# Patient Record
Sex: Female | Born: 1980 | Race: Black or African American | Hispanic: No | Marital: Single | Smoking: Current every day smoker
Health system: Southern US, Community
[De-identification: ages and names within clinical notes are randomized; demographics above are authoritative.]

## PROBLEM LIST (undated history)

## (undated) HISTORY — PX: HAND SURGERY: SHX662

## (undated) HISTORY — PX: TUBAL LIGATION: SHX77

---

## 2014-08-19 ENCOUNTER — Emergency Department (HOSPITAL_BASED_OUTPATIENT_CLINIC_OR_DEPARTMENT_OTHER)
Admission: EM | Admit: 2014-08-19 | Discharge: 2014-08-19 | Disposition: A | Discharge status: Home / Self Care | Payer: Self-pay | Attending: Emergency Medicine | Admitting: Emergency Medicine

## 2014-08-19 ENCOUNTER — Emergency Department (HOSPITAL_BASED_OUTPATIENT_CLINIC_OR_DEPARTMENT_OTHER): Payer: Self-pay

## 2014-08-19 ENCOUNTER — Encounter (HOSPITAL_BASED_OUTPATIENT_CLINIC_OR_DEPARTMENT_OTHER): Payer: Self-pay | Admitting: Emergency Medicine

## 2014-08-19 DIAGNOSIS — Y929 Unspecified place or not applicable: Secondary | ICD-10-CM | POA: Insufficient documentation

## 2014-08-19 DIAGNOSIS — W2209XA Striking against other stationary object, initial encounter: Secondary | ICD-10-CM | POA: Insufficient documentation

## 2014-08-19 DIAGNOSIS — Y939 Activity, unspecified: Secondary | ICD-10-CM | POA: Insufficient documentation

## 2014-08-19 DIAGNOSIS — S6980XA Other specified injuries of unspecified wrist, hand and finger(s), initial encounter: Secondary | ICD-10-CM | POA: Insufficient documentation

## 2014-08-19 DIAGNOSIS — S6992XA Unspecified injury of left wrist, hand and finger(s), initial encounter: Secondary | ICD-10-CM

## 2014-08-19 DIAGNOSIS — S6990XA Unspecified injury of unspecified wrist, hand and finger(s), initial encounter: Secondary | ICD-10-CM | POA: Insufficient documentation

## 2014-08-19 DIAGNOSIS — F17200 Nicotine dependence, unspecified, uncomplicated: Secondary | ICD-10-CM | POA: Insufficient documentation

## 2014-08-19 MED ORDER — NAPROXEN 500 MG PO TABS: 500.0000 mg | ORAL_TABLET | Freq: Two times a day (BID) | ORAL | Status: DC

## 2014-08-19 NOTE — ED Provider Notes (Signed)
CSN: [id number]     Arrival date & time 08/19/14  1617 History   First MD Initiated Contact with Patient 08/19/14 1622     Chief Complaint  Patient presents with  . Hand Pain     (Consider location/radiation/quality/duration/timing/severity/associated sxs/prior Treatment) Patient is a 33 y.o. female presenting with hand pain. The history is provided by the patient.  Hand Pain Pertinent negatives include no chest pain, no abdominal pain, no headaches and no shortness of breath.   patient with injury to the left palm when he hit the wall and then her thumb back that was 3 days ago. Patient with persistent swelling and pain and bruising. No other injuries. No previous injury to the thumb. Patient is right-hand dominant. Pain is currently 6/10. Worse with movement. Sharp in nature.  History reviewed. No pertinent past medical history. History reviewed. No pertinent past surgical history. No family history on file. History  Substance Use Topics  . Smoking status: Current Every Day Smoker  . Smokeless tobacco: Not on file  . Alcohol Use: Yes   OB History   Grav Para Term Preterm Abortions TAB SAB Ect Mult Living                 Review of Systems  Constitutional: Negative for fever.  HENT: Negative for congestion.   Eyes: Negative for redness.  Respiratory: Negative for shortness of breath.   Cardiovascular: Negative for chest pain.  Gastrointestinal: Negative for nausea and abdominal pain.  Musculoskeletal: Negative for neck pain.  Skin: Negative for rash.  Neurological: Negative for headaches.  Hematological: Does not bruise/bleed easily.  Psychiatric/Behavioral: Negative for confusion.      Allergies  Review of patient's allergies indicates no known allergies.  Home Medications   Prior to Admission medications   Medication Sig Start Date End Date Taking? Authorizing Provider  naproxen (NAPROSYN) 500 MG tablet Take 1 tablet (500 mg total) by mouth 2 (two) times daily.  08/19/14   Vanetta Mulders, MD   BP 101/73  Pulse 70  Temp(Src) 98.2 F (36.8 C)  Resp 16  Ht  (1.702 m)  Wt 130 lb (58.968 kg)  BMI 20.36 kg/m2  SpO2 100%  LMP 08/18/2014 Physical Exam  Nursing note and vitals reviewed. Constitutional: She is oriented to person, place, and time. She appears well-developed and well-nourished. No distress.  HENT:  Head: Normocephalic and atraumatic.  Mouth/Throat: Oropharynx is clear and moist.  Eyes: Conjunctivae and EOM are normal. Pupils are equal, round, and reactive to light.  Neck: Normal range of motion.  Cardiovascular: Normal rate and regular rhythm.   No murmur heard. Pulmonary/Chest: Effort normal and breath sounds normal. No respiratory distress.  Abdominal: Soft. Bowel sounds are normal. There is no tenderness.  Musculoskeletal: She exhibits tenderness.  Normal except for left palm with marked bruising of the thenar eminence. Also lots of bruising at the proximal joint. Good cap refill sensation intact. Decreased range of motion due to pain but very suspicious for a gamekeepers type injury.  Neurological: She is alert and oriented to person, place, and time. No cranial nerve deficit. She exhibits normal muscle tone. Coordination normal.  Skin: Skin is warm.    ED Course  Procedures (including critical care time) Labs Review Labs Reviewed - No data to display  Imaging Review Dg Finger Thumb Left  08/19/2014   CLINICAL DATA:  Blow to the left thumb, pain.  EXAM: LEFT THUMB 2+V  COMPARISON:  None.  FINDINGS: Imaged bones, joints  and soft tissues appear normal.  IMPRESSION: Negative exam.   Electronically Signed   By: Drusilla Kanner M.D.   On: 08/19/2014 16:46     EKG Interpretation None      MDM   Final diagnoses:  Thumb injury, left, initial encounter    Patient with injury to the left thumb area suspicious for a gamekeepers of ligament injury. Patient referred to hand surgery treated with a Velcro splint and  Naprosyn. Patient given a work note for no use of the left hand for 14 days.    Vanetta Mulders, MD 08/19/14 [phone]

## 2014-08-19 NOTE — ED Notes (Signed)
Reports hitting left thumb on wall Saturday. C/o pain, swelling and bruising

## 2014-08-19 NOTE — Discharge Instructions (Signed)
Keep splint in place. Take Naprosyn as directed. Followup with hand surgery. Injuries concerning for a gamekeepers injury. Work note provided.

## 2014-08-19 NOTE — ED Notes (Signed)
Patient transported to and from xray dept. 

## 2014-12-28 ENCOUNTER — Encounter (HOSPITAL_BASED_OUTPATIENT_CLINIC_OR_DEPARTMENT_OTHER): Payer: Self-pay

## 2014-12-28 ENCOUNTER — Emergency Department (HOSPITAL_BASED_OUTPATIENT_CLINIC_OR_DEPARTMENT_OTHER)
Admission: EM | Admit: 2014-12-28 | Discharge: 2014-12-28 | Disposition: A | Discharge status: Home / Self Care | Payer: Self-pay | Attending: Emergency Medicine | Admitting: Emergency Medicine

## 2014-12-28 DIAGNOSIS — Z791 Long term (current) use of non-steroidal anti-inflammatories (NSAID): Secondary | ICD-10-CM | POA: Insufficient documentation

## 2014-12-28 DIAGNOSIS — H00016 Hordeolum externum left eye, unspecified eyelid: Secondary | ICD-10-CM

## 2014-12-28 DIAGNOSIS — H00014 Hordeolum externum left upper eyelid: Secondary | ICD-10-CM | POA: Insufficient documentation

## 2014-12-28 DIAGNOSIS — Z720 Tobacco use: Secondary | ICD-10-CM | POA: Insufficient documentation

## 2014-12-28 NOTE — ED Provider Notes (Signed)
CSN: [id number]     Arrival date & time 12/28/14  1113 History   First MD Initiated Contact with Patient 12/28/14 1123     Chief Complaint  Patient presents with  . Eye Problem     (Consider location/radiation/quality/duration/timing/severity/associated sxs/prior Treatment) HPI Comments: Patient presents today with erythema and swelling of the left upper eyelid that has been present since yesterday morning.  She denies any injury or trauma to the eye.  She denies pain, but does report that her eye feels itchy.  She denies any vision changes, eye drainage, fever, or chills.  She does not wear contact lenses.  No treatment prior to arrival.  The history is provided by the patient.    History reviewed. No pertinent past medical history. Past Surgical History  Procedure Laterality Date  . Cesarean section     No family history on file. History  Substance Use Topics  . Smoking status: Light Tobacco Smoker  . Smokeless tobacco: Not on file  . Alcohol Use: Yes     Comment: occ   OB History    No data available     Review of Systems  All other systems reviewed and are negative.     Allergies  Review of patient's allergies indicates no known allergies.  Home Medications   Prior to Admission medications   Medication Sig Start Date End Date Taking? Authorizing Provider  naproxen (NAPROSYN) 500 MG tablet Take 1 tablet (500 mg total) by mouth 2 (two) times daily. 08/19/14   Vanetta Mulders, MD   BP 111/73 mmHg  Pulse 70  Temp(Src) 98.1 F (36.7 C) (Oral)  Resp 18  SpO2 100% Physical Exam  Constitutional: She appears well-developed and well-nourished.  HENT:  Head: Normocephalic and atraumatic.  Eyes: EOM are normal. Pupils are equal, round, and reactive to light. Lids are everted and swept, no foreign bodies found. Left eye exhibits hordeolum. Left eye exhibits no discharge and no exudate. No foreign body present in the left eye. Left conjunctiva is not injected. Left  conjunctiva has no hemorrhage.   Mild erythema and swelling of the left upper eyelid. No pain with EOM.  Visual Acuity  Right Eye Distance: 20/20 Left Eye Distance: 20/20 Bilateral Distance: 20/20       Neck: Normal range of motion. Neck supple.  Cardiovascular: Normal rate, regular rhythm and normal heart sounds.   Pulmonary/Chest: Effort normal and breath sounds normal.  Neurological: She is alert.  Skin: Skin is warm and dry.  Psychiatric: She has a normal mood and affect.    ED Course  Procedures (including critical care time) Labs Review Labs Reviewed - No data to display  Imaging Review No results found.   EKG Interpretation None      MDM   Final diagnoses:  Sty, left   Patient presents with a sty of her left upper eyelid.  No evidence of Conjunctivitis.  Normal visual acuity.  No foreign body.  Normal EOM without pain.  Feel that the patient is stable for discharge.  Return precautions given.     Santiago Glad, PA-C 12/29/14 1259  Glynn Octave, MD 12/29/14 [phone]

## 2014-12-28 NOTE — ED Notes (Signed)
Pt reports since yesterday with redness and swelling to L eyelid.  Pruritic.

## 2014-12-28 NOTE — Discharge Instructions (Signed)

## 2015-08-20 ENCOUNTER — Emergency Department (HOSPITAL_BASED_OUTPATIENT_CLINIC_OR_DEPARTMENT_OTHER): Payer: Worker's Compensation

## 2015-08-20 ENCOUNTER — Encounter (HOSPITAL_BASED_OUTPATIENT_CLINIC_OR_DEPARTMENT_OTHER): Payer: Self-pay | Admitting: Emergency Medicine

## 2015-08-20 ENCOUNTER — Emergency Department (HOSPITAL_BASED_OUTPATIENT_CLINIC_OR_DEPARTMENT_OTHER)
Admission: EM | Admit: 2015-08-20 | Discharge: 2015-08-20 | Disposition: A | Discharge status: Home / Self Care | Payer: Worker's Compensation | Attending: Emergency Medicine | Admitting: Emergency Medicine

## 2015-08-20 DIAGNOSIS — Y9389 Activity, other specified: Secondary | ICD-10-CM | POA: Diagnosis not present

## 2015-08-20 DIAGNOSIS — S61219A Laceration without foreign body of unspecified finger without damage to nail, initial encounter: Secondary | ICD-10-CM

## 2015-08-20 DIAGNOSIS — S61212A Laceration without foreign body of right middle finger without damage to nail, initial encounter: Secondary | ICD-10-CM | POA: Insufficient documentation

## 2015-08-20 DIAGNOSIS — Z720 Tobacco use: Secondary | ICD-10-CM | POA: Insufficient documentation

## 2015-08-20 DIAGNOSIS — S61214A Laceration without foreign body of right ring finger without damage to nail, initial encounter: Secondary | ICD-10-CM | POA: Diagnosis not present

## 2015-08-20 DIAGNOSIS — Y990 Civilian activity done for income or pay: Secondary | ICD-10-CM | POA: Diagnosis not present

## 2015-08-20 DIAGNOSIS — W290XXA Contact with powered kitchen appliance, initial encounter: Secondary | ICD-10-CM | POA: Insufficient documentation

## 2015-08-20 DIAGNOSIS — Y9289 Other specified places as the place of occurrence of the external cause: Secondary | ICD-10-CM | POA: Insufficient documentation

## 2015-08-20 DIAGNOSIS — R52 Pain, unspecified: Secondary | ICD-10-CM

## 2015-08-20 MED ORDER — OXYCODONE-ACETAMINOPHEN 5-325 MG PO TABS
1.0000 | ORAL_TABLET | Freq: Once | ORAL | Status: AC
  Administered 2015-08-20: 1 via ORAL
  Filled 2015-08-20: qty 1

## 2015-08-20 MED ORDER — BUPIVACAINE HCL 0.5 % IJ SOLN
20.0000 mL | Freq: Once | INTRAMUSCULAR | Status: AC
  Administered 2015-08-20: 1 mL
  Filled 2015-08-20: qty 1

## 2015-08-20 NOTE — ED Notes (Signed)
UDS obtained by Nando, EMT.

## 2015-08-20 NOTE — ED Notes (Signed)
Injury to rt long and ring fingers.  Injury ar work fingers cut on a bread slicer

## 2015-08-20 NOTE — ED Provider Notes (Signed)
CSN: [id number]     Arrival date & time 08/20/15  7829 History   First MD Initiated Contact with Patient 08/20/15 [phone]     Chief Complaint  Patient presents with  . Extremity Laceration      HPI  Patient since evaluation of right hand lacerations to her right middle, and ring finger. She works at a [employer]. She was cutting some bread on a electrical spitting bread slicer. Some red became caught sheet and could remove it. She Palms Side of Her Right Hand against That. Lacerated His 2 Fingers. She States They Seem to Move Normally. Normal Sensation. States "They Bled A Lot".  History reviewed. No pertinent past medical history. Past Surgical History  Procedure Laterality Date  . Cesarean section     No family history on file. Social History  Substance Use Topics  . Smoking status: Light Tobacco Smoker  . Smokeless tobacco: None  . Alcohol Use: Yes     Comment: occ   OB History    No data available     Review of Systems  Skin: Positive for wound.      Allergies  Review of patient's allergies indicates no known allergies.  Home Medications   Prior to Admission medications   Not on File   BP 104/67 mmHg  Pulse 79  Temp(Src) 98.3 F (36.8 C) (Oral)  Resp 18  Ht  (1.702 m)  Wt 130 lb (58.968 kg)  BMI 20.36 kg/m2  SpO2 100% Physical Exam  Constitutional: She is oriented to person, place, and time. She appears well-developed and well-nourished. No distress.  HENT:  Head: Normocephalic.  Eyes: Conjunctivae are normal. Pupils are equal, round, and reactive to light. No scleral icterus.  Neck: Normal range of motion. Neck supple. No thyromegaly present.  Cardiovascular: Normal rate and regular rhythm.  Exam reveals no gallop and no friction rub.   No murmur heard. Pulmonary/Chest: Effort normal and breath sounds normal. No respiratory distress. She has no wheezes. She has no rales.  Abdominal: Soft. Bowel sounds are normal. She exhibits no distension. There  is no tenderness. There is no rebound.  Musculoskeletal: Normal range of motion.       Hands: Neurological: She is alert and oriented to person, place, and time.  Skin: Skin is warm and dry. No rash noted.  Psychiatric: She has a normal mood and affect. Her behavior is normal.    ED Course  Procedures (including critical care time) Labs Review Labs Reviewed - No data to display  Imaging Review Dg Hand Complete Right  08/20/2015   CLINICAL DATA:  Trauma to the third and fourth digits with a bread slicer, initial encounter  EXAM: RIGHT HAND - COMPLETE 3+ VIEW  COMPARISON:  None.  FINDINGS: Soft tissue injury is noted in the distal aspect of the third and fourth digits. No underlying bony abnormality is seen.  IMPRESSION: Soft tissue injury in the third and fourth digits without underlying bony abnormality.   Electronically Signed   By: Alcide Clever M.D.   On: 08/20/2015 10:39   I have personally reviewed and evaluated these images and lab results as part of my medical decision-making.   EKG Interpretation None      MDM   Final diagnoses:  Pain  Finger laceration, initial encounter    LACERATION REPAIR Performed by: Claudean Kinds Authorized by: Claudean Kinds Consent: Verbal consent obtained. Risks and benefits: risks, benefits and alternatives were discussed Consent given by: patient  Patient identity confirmed: provided demographic data Prepped and Draped in normal sterile fashion Wound explored  Laceration Location: RMF, RRF  Laceration Length:  Total 4 cmcm  No Foreign Bodies seen or palpated  Anesthesia: local infiltration  Local anesthetic: lidocaine 1% s epinephrine  Anesthetic total: 6 ml, digital blocks x 2  Irrigation method: syringe Amount of cleaning: standard  Skin closure: 4-0 Nylon  Number of sutures: 12  Technique: simple  Patient tolerance: Patient tolerated the procedure well with no immediate complications.     Rolland Porter,  MD 08/20/15 1416

## 2015-08-20 NOTE — Discharge Instructions (Signed)
°  Suture removal in 10 days. Recheck with your work's occupational health clinic for removal.  Laceration Care, Adult A laceration is a cut that goes through all layers of the skin. The cut goes into the tissue beneath the skin. HOME CARE For stitches (sutures) or staples:  Keep the cut clean and dry.  If you have a bandage (dressing), change it at least once a day. Change the bandage if it gets wet or dirty, or as told by your doctor.  Wash the cut with soap and water 2 times a day. Rinse the cut with water. Pat it dry with a clean towel.  Put a thin layer of medicated cream on the cut as told by your doctor.  You may shower after the first 24 hours. Do not soak the cut in water until the stitches are removed.  Only take medicines as told by your doctor.  Have your stitches or staples removed as told by your doctor. For skin adhesive strips:  Keep the cut clean and dry.  Do not get the strips wet. You may take a bath, but be careful to keep the cut dry.  If the cut gets wet, pat it dry with a clean towel.  The strips will fall off on their own. Do not remove the strips that are still stuck to the cut. For wound glue:  You may shower or take baths. Do not soak or scrub the cut. Do not swim. Avoid heavy sweating until the glue falls off on its own. After a shower or bath, pat the cut dry with a clean towel.  Do not put medicine on your cut until the glue falls off.  If you have a bandage, do not put tape over the glue.  Avoid lots of sunlight or tanning lamps until the glue falls off. Put sunscreen on the cut for the first year to reduce your scar.  The glue will fall off on its own. Do not pick at the glue. You may need a tetanus shot if:  You cannot remember when you had your last tetanus shot.  You have never had a tetanus shot. If you need a tetanus shot and you choose not to have one, you may get tetanus. Sickness from tetanus can be serious. GET HELP RIGHT AWAY IF:    Your pain does not get better with medicine.  Your arm, hand, leg, or foot loses feeling (numbness) or changes color.  Your cut is bleeding.  Your joint feels weak, or you cannot use your joint.  You have painful lumps on your body.  Your cut is red, puffy (swollen), or painful.  You have a red line on the skin near the cut.  You have yellowish-white fluid (pus) coming from the cut.  You have a fever.  You have a bad smell coming from the cut or bandage.  Your cut breaks open before or after stitches are removed.  You notice something coming out of the cut, such as wood or glass.  You cannot move a finger or toe. MAKE SURE YOU:   Understand these instructions.  Will watch your condition.  Will get help right away if you are not doing well or get worse. Document Released: 05/29/2008 Document Revised: 03/04/2012 Document Reviewed: 06/06/2011 [facility] Patient Information 2015 [place], [place]. This information is not intended to replace advice given to you by your health care provider. Make sure you discuss any questions you have with your health care provider.

## 2015-08-31 ENCOUNTER — Encounter (HOSPITAL_BASED_OUTPATIENT_CLINIC_OR_DEPARTMENT_OTHER): Payer: Self-pay

## 2015-08-31 ENCOUNTER — Emergency Department (HOSPITAL_BASED_OUTPATIENT_CLINIC_OR_DEPARTMENT_OTHER)
Admission: EM | Admit: 2015-08-31 | Discharge: 2015-08-31 | Disposition: A | Discharge status: Home / Self Care | Payer: Worker's Compensation | Attending: Emergency Medicine | Admitting: Emergency Medicine

## 2015-08-31 DIAGNOSIS — Z720 Tobacco use: Secondary | ICD-10-CM | POA: Insufficient documentation

## 2015-08-31 DIAGNOSIS — Z4802 Encounter for removal of sutures: Secondary | ICD-10-CM | POA: Insufficient documentation

## 2015-08-31 NOTE — ED Provider Notes (Signed)
CSN: [id number]     Arrival date & time 08/31/15  1428 History   First MD Initiated Contact with Patient 08/31/15 1544     Chief Complaint  Patient presents with  . Suture / Staple Removal     (Consider location/radiation/quality/duration/timing/severity/associated sxs/prior Treatment) HPI Comments: 34 year old female presenting for suture removal from her right third and fourth fingers that were placed on 08/20/2015 after cutting them on a bread cutter. States her fingers have been very sore but are getting better. Pain worse with movement. Denies swelling or drainage. No fevers.  Patient is a 34 y.o. female presenting with suture removal. The history is provided by the patient.  Suture / Staple Removal This is a new problem. The current episode started 1 to 4 weeks ago. The problem occurs constantly. The problem has been gradually improving. Pertinent negatives include no fever or numbness. Exacerbated by: movement. She has tried nothing for the symptoms.    History reviewed. No pertinent past medical history. Past Surgical History  Procedure Laterality Date  . Cesarean section     No family history on file. Social History  Substance Use Topics  . Smoking status: Current Every Day Smoker  . Smokeless tobacco: None  . Alcohol Use: Yes     Comment: occ   OB History    No data available     Review of Systems  Constitutional: Negative for fever.  Musculoskeletal: Negative.   Skin: Positive for wound.  Neurological: Negative for numbness.      Allergies  Review of patient's allergies indicates no known allergies.  Home Medications   Prior to Admission medications   Not on File   BP 111/70 mmHg  Pulse 92  Temp(Src) 98.2 F (36.8 C) (Oral)  Resp 16  Ht  (1.702 m)  Wt 130 lb (58.968 kg)  BMI 20.36 kg/m2  SpO2 100%  LMP 08/09/2015 Physical Exam  Constitutional: She is oriented to person, place, and time. She appears well-developed and well-nourished. No  distress.  HENT:  Head: Normocephalic and atraumatic.  Mouth/Throat: Oropharynx is clear and moist.  Eyes: Conjunctivae and EOM are normal.  Neck: Normal range of motion. Neck supple.  Cardiovascular: Normal rate, regular rhythm and normal heart sounds.   Pulmonary/Chest: Effort normal and breath sounds normal. No respiratory distress.  Musculoskeletal: Normal range of motion. She exhibits no edema.  Full range of motion of third and fourth digit of right hand. Full flexion and extension at MCP, PIP and DIP. Sensation intact.  Neurological: She is alert and oriented to person, place, and time. No sensory deficit.  Skin: Skin is warm and dry.  Well healed lacerations (2 cm to distal aspect of 4th difit, fishmouth laceration of R middle finger) right hand on palmar aspect. Sutures in place.  Psychiatric: She has a normal mood and affect. Her behavior is normal.  Nursing note and vitals reviewed.   ED Course  Procedures (including critical care time) SUTURE REMOVAL Performed by: Celene Skeen  Consent: Verbal consent obtained. Patient identity confirmed: provided demographic data Time out: Immediately prior to procedure a "time out" was called to verify the correct patient, procedure, equipment, support staff and site/side marked as required.  Location details: R 3rd and 4th digit  Wound Appearance: clean  Sutures/Staples Removed: 12  Facility: sutures placed in this facility Patient tolerance: Patient tolerated the procedure well with no immediate complications.    Labs Review Labs Reviewed - No data to display  Imaging Review No results  found. I have personally reviewed and evaluated these images and lab results as part of my medical decision-making.   EKG Interpretation None      MDM   Final diagnoses:  Visit for suture removal   Wounds healed well. Sutures removed. Neurovascularly intact distally. Stable for discharge. Return precautions given. Patient states  understanding of treatment care plan and is agreeable.  Kathrynn Speed, PA-C 08/31/15 1614  Glynn Octave, MD 08/31/15 2008

## 2015-08-31 NOTE — ED Notes (Signed)
Suture removal to right 3rd and 4th fingers

## 2020-02-18 ENCOUNTER — Emergency Department (HOSPITAL_BASED_OUTPATIENT_CLINIC_OR_DEPARTMENT_OTHER)
Admission: EM | Admit: 2020-02-18 | Discharge: 2020-02-18 | Disposition: A | Discharge status: Home / Self Care | Payer: Self-pay | Attending: Emergency Medicine | Admitting: Emergency Medicine

## 2020-02-18 ENCOUNTER — Emergency Department (HOSPITAL_BASED_OUTPATIENT_CLINIC_OR_DEPARTMENT_OTHER): Payer: Self-pay

## 2020-02-18 ENCOUNTER — Encounter (HOSPITAL_BASED_OUTPATIENT_CLINIC_OR_DEPARTMENT_OTHER): Payer: Self-pay | Admitting: Emergency Medicine

## 2020-02-18 ENCOUNTER — Other Ambulatory Visit: Payer: Self-pay

## 2020-02-18 DIAGNOSIS — R0789 Other chest pain: Secondary | ICD-10-CM | POA: Insufficient documentation

## 2020-02-18 DIAGNOSIS — F17200 Nicotine dependence, unspecified, uncomplicated: Secondary | ICD-10-CM | POA: Insufficient documentation

## 2020-02-18 LAB — PREGNANCY, URINE: Preg Test, Ur: NEGATIVE

## 2020-02-18 MED ORDER — OXYCODONE HCL 5 MG PO TABS
5.0000 mg | ORAL_TABLET | Freq: Once | ORAL | Status: AC
  Administered 2020-02-18: 5 mg via ORAL
  Filled 2020-02-18: qty 1

## 2020-02-18 MED ORDER — KETOROLAC TROMETHAMINE 15 MG/ML IJ SOLN
15.0000 mg | Freq: Once | INTRAMUSCULAR | Status: AC
  Administered 2020-02-18: 15 mg via INTRAVENOUS
  Filled 2020-02-18: qty 1

## 2020-02-18 MED ORDER — DIAZEPAM 5 MG PO TABS
5.0000 mg | ORAL_TABLET | Freq: Once | ORAL | Status: AC
  Administered 2020-02-18: 13:00:00 5 mg via ORAL
  Filled 2020-02-18: qty 1

## 2020-02-18 MED ORDER — ACETAMINOPHEN 500 MG PO TABS
1000.0000 mg | ORAL_TABLET | Freq: Once | ORAL | Status: AC
  Administered 2020-02-18: 1000 mg via ORAL
  Filled 2020-02-18: qty 2

## 2020-02-18 NOTE — ED Provider Notes (Signed)
[facility] EMERGENCY DEPARTMENT Provider Note   CSN: [id number] Arrival date & time: 02/18/20  1112     History Chief Complaint  Patient presents with  . Abdominal Pain    Cynthia Downs is a 39 y.o. female.  39 yo F with a chief complaint of right side pain.  Going on for about 2 weeks now.  Worse with twisting turning bending palpation coughing deep breathing.  She denies trauma to the area denies fevers denies any issue with eating or drinking.  No vomiting or diarrhea.  No rash.  She feels that this radiates from the back to the front and then sometimes goes down the rest of her side into her leg.  The history is provided by the patient.  Abdominal Pain Associated symptoms: chest pain   Associated symptoms: no chills, no dysuria, no fever, no nausea, no shortness of breath and no vomiting   Illness Severity:  Moderate Onset quality:  Gradual Duration:  2 days Timing:  Constant Progression:  Worsening Chronicity:  New Associated symptoms: chest pain and myalgias   Associated symptoms: no abdominal pain, no congestion, no fever, no headaches, no nausea, no rhinorrhea, no shortness of breath, no vomiting and no wheezing        History reviewed. No pertinent past medical history.  There are no problems to display for this patient.   Past Surgical History:  Procedure Laterality Date  . CESAREAN SECTION    . HAND SURGERY    . TUBAL LIGATION       OB History   No obstetric history on file.     History reviewed. No pertinent family history.  Social History   Tobacco Use  . Smoking status: Current Every Day Smoker  Substance Use Topics  . Alcohol use: Yes    Comment: occ  . Drug use: No    Home Medications Prior to Admission medications   Not on File    Allergies    Patient has no known allergies.  Review of Systems   Review of Systems  Constitutional: Negative for chills and fever.  HENT: Negative for congestion and rhinorrhea.     Eyes: Negative for redness and visual disturbance.  Respiratory: Negative for shortness of breath and wheezing.   Cardiovascular: Positive for chest pain. Negative for palpitations.  Gastrointestinal: Negative for abdominal pain, nausea and vomiting.  Genitourinary: Negative for dysuria and urgency.  Musculoskeletal: Positive for arthralgias and myalgias.  Skin: Negative for pallor and wound.  Neurological: Negative for dizziness and headaches.    Physical Exam Updated Vital Signs BP 97/64 (BP Location: Right Arm)   Pulse 60   Temp 98.7 F (37.1 C) (Oral)   Resp 14   Ht 5\' 7"  (1.702 m)   LMP 01/14/2020 (Approximate)   SpO2 100%   BMI 20.36 kg/m   Physical Exam Vitals and nursing note reviewed.  Constitutional:      General: She is not in acute distress.    Appearance: She is well-developed. She is not diaphoretic.  HENT:     Head: Normocephalic and atraumatic.  Eyes:     Pupils: Pupils are equal, round, and reactive to light.  Cardiovascular:     Rate and Rhythm: Normal rate and regular rhythm.     Heart sounds: No murmur. No friction rub. No gallop.   Pulmonary:     Effort: Pulmonary effort is normal.     Breath sounds: No wheezing or rales.  Abdominal:  General: There is no distension.     Palpations: Abdomen is soft.     Tenderness: There is abdominal tenderness.     Comments: Tenderness about the right side about the anterior axillary line.  Worst over the right anterior chest wall and along the right mid axillary line.  She has a small scabbed area at the center of her thoracic spine.  No other appreciable rash.  Musculoskeletal:        General: No tenderness.     Cervical back: Normal range of motion and neck supple.  Skin:    General: Skin is warm and dry.  Neurological:     Mental Status: She is alert and oriented to person, place, and time.  Psychiatric:        Behavior: Behavior normal.     ED Results / Procedures / Treatments   Labs (all labs  ordered are listed, but only abnormal results are displayed) Labs Reviewed  PREGNANCY, URINE    EKG None  Radiology No results found.  Procedures Procedures (including critical care time)  Medications Ordered in ED Medications  acetaminophen (TYLENOL) tablet 1,000 mg (1,000 mg Oral Given 02/18/20 1248)  ketorolac (TORADOL) 15 MG/ML injection 15 mg (15 mg Intravenous Given 02/18/20 1248)  oxyCODONE (Oxy IR/ROXICODONE) immediate release tablet 5 mg (5 mg Oral Given 02/18/20 1248)  diazepam (VALIUM) tablet 5 mg (5 mg Oral Given 02/18/20 1248)    ED Course  I have reviewed the triage vital signs and the nursing notes.  Pertinent labs & imaging results that were available during my care of the patient were reviewed by me and considered in my medical decision making (see chart for details).    MDM Rules/Calculators/A&P                      39 yo F with a chief complaints of right-sided thoracic back pain.  This been going on for about 2 weeks.  Sounds muscular by history.  Shingles is also in the differential the patient denies any rash she has 1 scabbed over lesion to the center of her spine which she describes as a area that she had scratched.  Will obtain a chest x-ray as she is also describing a mild cough.  She is worried she may be pregnant we will send a pregnancy test.  Pregnancy test is negative.  Chest x-ray viewed by me without fracture pneumothorax or focal infiltrate.  Treat as musculoskeletal.  PCP follow-up.  2:40 PM:  I have discussed the diagnosis/risks/treatment options with the patient and believe the pt to be eligible for discharge home to follow-up with PCP. We also discussed returning to the ED immediately if new or worsening sx occur. We discussed the sx which are most concerning (e.g., sudden worsening pain, fever, inability to tolerate by mouth) that necessitate immediate return. Medications administered to the patient during their visit and any new prescriptions  provided to the patient are listed below.  Medications given during this visit Medications  acetaminophen (TYLENOL) tablet 1,000 mg (1,000 mg Oral Given 02/18/20 1248)  ketorolac (TORADOL) 15 MG/ML injection 15 mg (15 mg Intravenous Given 02/18/20 1248)  oxyCODONE (Oxy IR/ROXICODONE) immediate release tablet 5 mg (5 mg Oral Given 02/18/20 1248)  diazepam (VALIUM) tablet 5 mg (5 mg Oral Given 02/18/20 1248)     The patient appears reasonably screen and/or stabilized for discharge and I doubt any other medical condition or other [facility] requiring further screening, evaluation, or treatment  in the ED at this time prior to discharge.   Final Clinical Impression(s) / ED Diagnoses Final diagnoses:  Right-sided chest wall pain    Rx / DC Orders ED Discharge Orders    None       Melene Plan, DO 02/18/20 1440

## 2020-02-18 NOTE — Discharge Instructions (Signed)
Take 4 over the counter ibuprofen tablets 3 times a day or 2 over-the-counter naproxen tablets twice a day for pain. Also take tylenol 1000mg(2 extra strength) four times a day.    

## 2020-02-18 NOTE — ED Triage Notes (Signed)
Pt here with RUQ pain x 2 weeks. No other sx. Period late. Has had tubal ligation.

## 2021-06-18 ENCOUNTER — Emergency Department (HOSPITAL_BASED_OUTPATIENT_CLINIC_OR_DEPARTMENT_OTHER)
Admission: EM | Admit: 2021-06-18 | Discharge: 2021-06-18 | Disposition: A | Discharge status: Home / Self Care | Payer: Self-pay | Attending: Emergency Medicine | Admitting: Emergency Medicine

## 2021-06-18 ENCOUNTER — Other Ambulatory Visit: Payer: Self-pay

## 2021-06-18 ENCOUNTER — Encounter (HOSPITAL_BASED_OUTPATIENT_CLINIC_OR_DEPARTMENT_OTHER): Payer: Self-pay | Admitting: Emergency Medicine

## 2021-06-18 DIAGNOSIS — R55 Syncope and collapse: Secondary | ICD-10-CM | POA: Insufficient documentation

## 2021-06-18 DIAGNOSIS — M25512 Pain in left shoulder: Secondary | ICD-10-CM | POA: Insufficient documentation

## 2021-06-18 DIAGNOSIS — F17210 Nicotine dependence, cigarettes, uncomplicated: Secondary | ICD-10-CM | POA: Insufficient documentation

## 2021-06-18 LAB — CBC WITH DIFFERENTIAL/PLATELET
Abs Immature Granulocytes: 0.02 10*3/uL (ref 0.00–0.07)
Basophils Absolute: 0 10*3/uL (ref 0.0–0.1)
Basophils Relative: 1 %
Eosinophils Absolute: 0.3 10*3/uL (ref 0.0–0.5)
Eosinophils Relative: 6 %
HCT: 44 % (ref 36.0–46.0)
Hemoglobin: 14.3 g/dL (ref 12.0–15.0)
Immature Granulocytes: 0 %
Lymphocytes Relative: 29 %
Lymphs Abs: 1.7 10*3/uL (ref 0.7–4.0)
MCH: 29.1 pg (ref 26.0–34.0)
MCHC: 32.5 g/dL (ref 30.0–36.0)
MCV: 89.4 fL (ref 80.0–100.0)
Monocytes Absolute: 0.6 10*3/uL (ref 0.1–1.0)
Monocytes Relative: 10 %
Neutro Abs: 3.3 10*3/uL (ref 1.7–7.7)
Neutrophils Relative %: 54 %
Platelets: 213 10*3/uL (ref 150–400)
RBC: 4.92 MIL/uL (ref 3.87–5.11)
RDW: 14.2 % (ref 11.5–15.5)
WBC: 6 10*3/uL (ref 4.0–10.5)
nRBC: 0 % (ref 0.0–0.2)

## 2021-06-18 LAB — BASIC METABOLIC PANEL
Anion gap: 6 (ref 5–15)
BUN: 12 mg/dL (ref 6–20)
CO2: 25 mmol/L (ref 22–32)
Calcium: 8.8 mg/dL — ABNORMAL LOW (ref 8.9–10.3)
Chloride: 105 mmol/L (ref 98–111)
Creatinine, Ser: 1.3 mg/dL — ABNORMAL HIGH (ref 0.44–1.00)
GFR, Estimated: 54 mL/min — ABNORMAL LOW (ref >60)
Glucose, Bld: 108 mg/dL — ABNORMAL HIGH (ref 70–99)
Potassium: 4.3 mmol/L (ref 3.5–5.1)
Sodium: 136 mmol/L (ref 135–145)

## 2021-06-18 MED ORDER — SODIUM CHLORIDE 0.9 % IV BOLUS
500.0000 mL | Freq: Once | INTRAVENOUS | Status: AC
  Administered 2021-06-18: 500 mL via INTRAVENOUS

## 2021-06-18 NOTE — Discharge Instructions (Addendum)
Your history, physical exam, and work-up today was reassuring.  Suspect vasovagal syncope in context of not eating/drinking today.  You had a very mild bump in your kidney function which suggest that you were likely dehydrated.  I recommend liquid IV or crystallite packets to help you increase oral hydration.  Please also ensure that you are eating meals regularly.  Please follow-up with your primary care provider regarding today's ED encounter for ongoing evaluation and management.  Return to the ER if you develop any new or worsening symptoms.

## 2021-06-18 NOTE — ED Triage Notes (Signed)
Reports hx of heatstroke.  Says she felt like she was gonna pass out after getting out of the shower.  Woke up with boyfriend picking her up off the floor.

## 2021-06-18 NOTE — ED Provider Notes (Addendum)
[facility] EMERGENCY DEPARTMENT Provider Note   CSN: [id number] Arrival date & time: 06/18/21  1625     History Chief Complaint  Patient presents with   Loss of Consciousness    Cynthia Downs is a 40 y.o. female with no relevant past medical history presents the ED with complaints of heat stroke.  On my examination, patient reports that she did not spend any significant time outside this morning.  She actually was having a good day and then was taking a shower around 345 PM when she became lightheaded and felt as though she might pass out.  She was able to get herself out of the shower and start to head to the bed when she passed out.  Her boyfriend found her on the bedroom for her and encouraged her to come to the ED for evaluation.  She tells me that this is happened multiple times in the past.  Patient reports that is usually in the context of dehydration.  She states that she does not like the taste of water.  She also admits that she had not had anything to eat or drink all day.  After she passed out, she was able to eat some food and drink some water.  She is starting to feel improved.  She denies any convulsions while she was passed out and states that she only passed out for a few moments before he was able to arouse her.  She denied any postictal confusion or history of seizure disorder.  There was no incontinence.  She has mild left-sided trapezial discomfort that she attributes to sleeping awkwardly last night.  She denies any new injuries from the fall.    She denies any headache or dizziness, diplopia, numbness or weakness, inability to ambulate, chest pain, palpitations, history of clots or clotting disorder, recent unilateral extremity swelling or edema, illicit drug use, or other symptoms.  She adamantly denies any cardiac symptoms during her lightheadedness presyncopal prodome.  HPI     History reviewed. No pertinent past medical history.  There are no  problems to display for this patient.   Past Surgical History:  Procedure Laterality Date   CESAREAN SECTION     HAND SURGERY     TUBAL LIGATION       OB History   No obstetric history on file.     No family history on file.  Social History   Tobacco Use   Smoking status: Every Day    Pack years: 0.00    Types: Cigarettes  Substance Use Topics   Alcohol use: Yes    Comment: occ   Drug use: No    Home Medications Prior to Admission medications   Not on File    Allergies    Patient has no known allergies.  Review of Systems   Review of Systems  All other systems reviewed and are negative.  Physical Exam Updated Vital Signs BP 102/71   Pulse 64   Temp 98.2 F (36.8 C) (Oral)   Resp 16   Ht 5\' 7"  (1.702 m)   Wt 70.3 kg   LMP 06/18/2021   SpO2 100%   BMI 24.28 kg/m   Physical Exam Vitals and nursing note reviewed. Exam conducted with a chaperone present.  Constitutional:      General: She is not in acute distress.    Appearance: Normal appearance. She is not ill-appearing.  HENT:     Head: Normocephalic and atraumatic.     Comments:  No obvious head injuries. Eyes:     General: No scleral icterus.    Extraocular Movements: Extraocular movements intact.     Conjunctiva/sclera: Conjunctivae normal.     Pupils: Pupils are equal, round, and reactive to light.  Neck:     Comments: No midline cervical tenderness to palpation.  Mild left-sided trapezial region TTP.  No overlying skin changes.  ROM intact.  No meningismus. Cardiovascular:     Rate and Rhythm: Normal rate and regular rhythm.     Pulses: Normal pulses.     Heart sounds: Normal heart sounds.     Comments: No obvious murmur. Pulmonary:     Effort: Pulmonary effort is normal. No respiratory distress.     Breath sounds: Normal breath sounds. No wheezing or rales.     Comments: CTA bilaterally. Musculoskeletal:        General: Normal range of motion.     Cervical back: Normal range of  motion. No rigidity.  Skin:    General: Skin is dry.     Comments: No skin tenting.  Neurological:     General: No focal deficit present.     Mental Status: She is alert and oriented to person, place, and time.     GCS: GCS eye subscore is 4. GCS verbal subscore is 5. GCS motor subscore is 6.     Cranial Nerves: No cranial nerve deficit.     Sensory: No sensory deficit.     Motor: No weakness.     Coordination: Coordination normal.     Gait: Gait normal.  Psychiatric:        Mood and Affect: Mood normal.        Behavior: Behavior normal.        Thought Content: Thought content normal.    ED Results / Procedures / Treatments   Labs (all labs ordered are listed, but only abnormal results are displayed) Labs Reviewed  BASIC METABOLIC PANEL - Abnormal; Notable for the following components:      Result Value   Glucose, Bld 108 (*)    Creatinine, Ser 1.30 (*)    Calcium 8.8 (*)    GFR, Estimated 54 (*)    All other components within normal limits  CBC WITH DIFFERENTIAL/PLATELET    EKG EKG Interpretation  Date/Time:  Saturday June 18 2021 16:38:32 EDT Ventricular Rate:  72 PR Interval:  137 QRS Duration: 95 QT Interval:  400 QTC Calculation: 438 R Axis:   -18 Text Interpretation: Sinus rhythm Borderline left axis deviation RSR' in V1 or V2, probably normal variant No previous ECGs available Confirmed by Frederick Peers [phone]) on 06/18/2021 5:14:40 PM  Radiology No results found.  Procedures Procedures   Medications Ordered in ED Medications  sodium chloride 0.9 % bolus 500 mL ( Intravenous Stopped 06/18/21 1820)    ED Course  I have reviewed the triage vital signs and the nursing notes.  Pertinent labs & imaging results that were available during my care of the patient were reviewed by me and considered in my medical decision making (see chart for details).    MDM Rules/Calculators/A&P                          Cynthia Downs was evaluated in Emergency  Department on 06/18/2021 for the symptoms described in the history of present illness. She was evaluated in the context of the global COVID-19 pandemic, which necessitated consideration that the patient might be at  risk for infection with the SARS-CoV-2 virus that causes COVID-19. Institutional protocols and algorithms that pertain to the evaluation of patients at risk for COVID-19 are in a state of rapid change based on information released by regulatory bodies including the CDC and federal and state organizations. These policies and algorithms were followed during the patient's care in the ED.  I personally reviewed patient's medical chart and all notes from triage and staff during today's encounter. I have also ordered and reviewed all labs and imaging that I felt to be medically necessary in the evaluation of this patient's complaints and with consideration of their physical exam. If needed, translation services were available and utilized.   She was concerned for heat stroke, but she is afebrile here in the ED and no nothing in her history of present illness suggest that this is heat related.  Instead, suspect vasovagal syncope in the context of not eating and drinking.  She admits that she felt improved after eating some food and drink some water after brief syncope.  There is nothing in her HPI to suggest this was seizure disorder.  No evidence of lateral tongue biting on oropharyngeal exam.  She is currently on her menses.  Will obtain CBC to assess for anemia as contributing factor.  We will also obtain BMP to assess renal function given patient's report of poor p.o. intake in context of not liking the taste of water.  We will also obtain EKG, but her history of presyncopal prodrome is less concerning for cardiac etiology.  EKG with sinus rhythm, nonconcerning.  Basic labs are without any anemia or leukocytosis concerning for infection.  BMP with very mild creatinine elevation at 1.30 and GFR reduced  to 54.  No priors with which to compare.  Unclear if very mild AKI due to diminished p.o. intake.  This likely would have contributed to her lightheadedness and syncopal episode.  No electrolyte derangement.  Orthostatic blood pressures here in the ER was reassuring.  She feels improved after 500 cc bolus NS.  No history of exertional syncope.  She states that her episodes of syncope typically involve dehydration.  Discussed crystallite packets and liquid IV to help encourage increased oral hydration given of distaste for water.  Low suspicion for cardiac etiology.  Encourage her to follow-up with primary care provider regarding today's ED encounter for ongoing evaluation and management.    ER return precautions discussed.  Patient voices understanding and is agreeable.  Final Clinical Impression(s) / ED Diagnoses Final diagnoses:  Vasovagal syncope    Rx / DC Orders ED Discharge Orders     None        Lorelee New, PA-C 06/18/21 1915    Lorelee New, PA-C 06/18/21 1915    Little, Ambrose [place], MD 06/18/21 2320

## 2021-09-30 IMAGING — CR DG CHEST 2V
2 series · 2 of 2 positions shown · non-contrast
Comparison: None

CLINICAL DATA: Right chest wall pain, right upper quadrant pain

EXAM:
CHEST - 2 VIEW

[w chest pa]
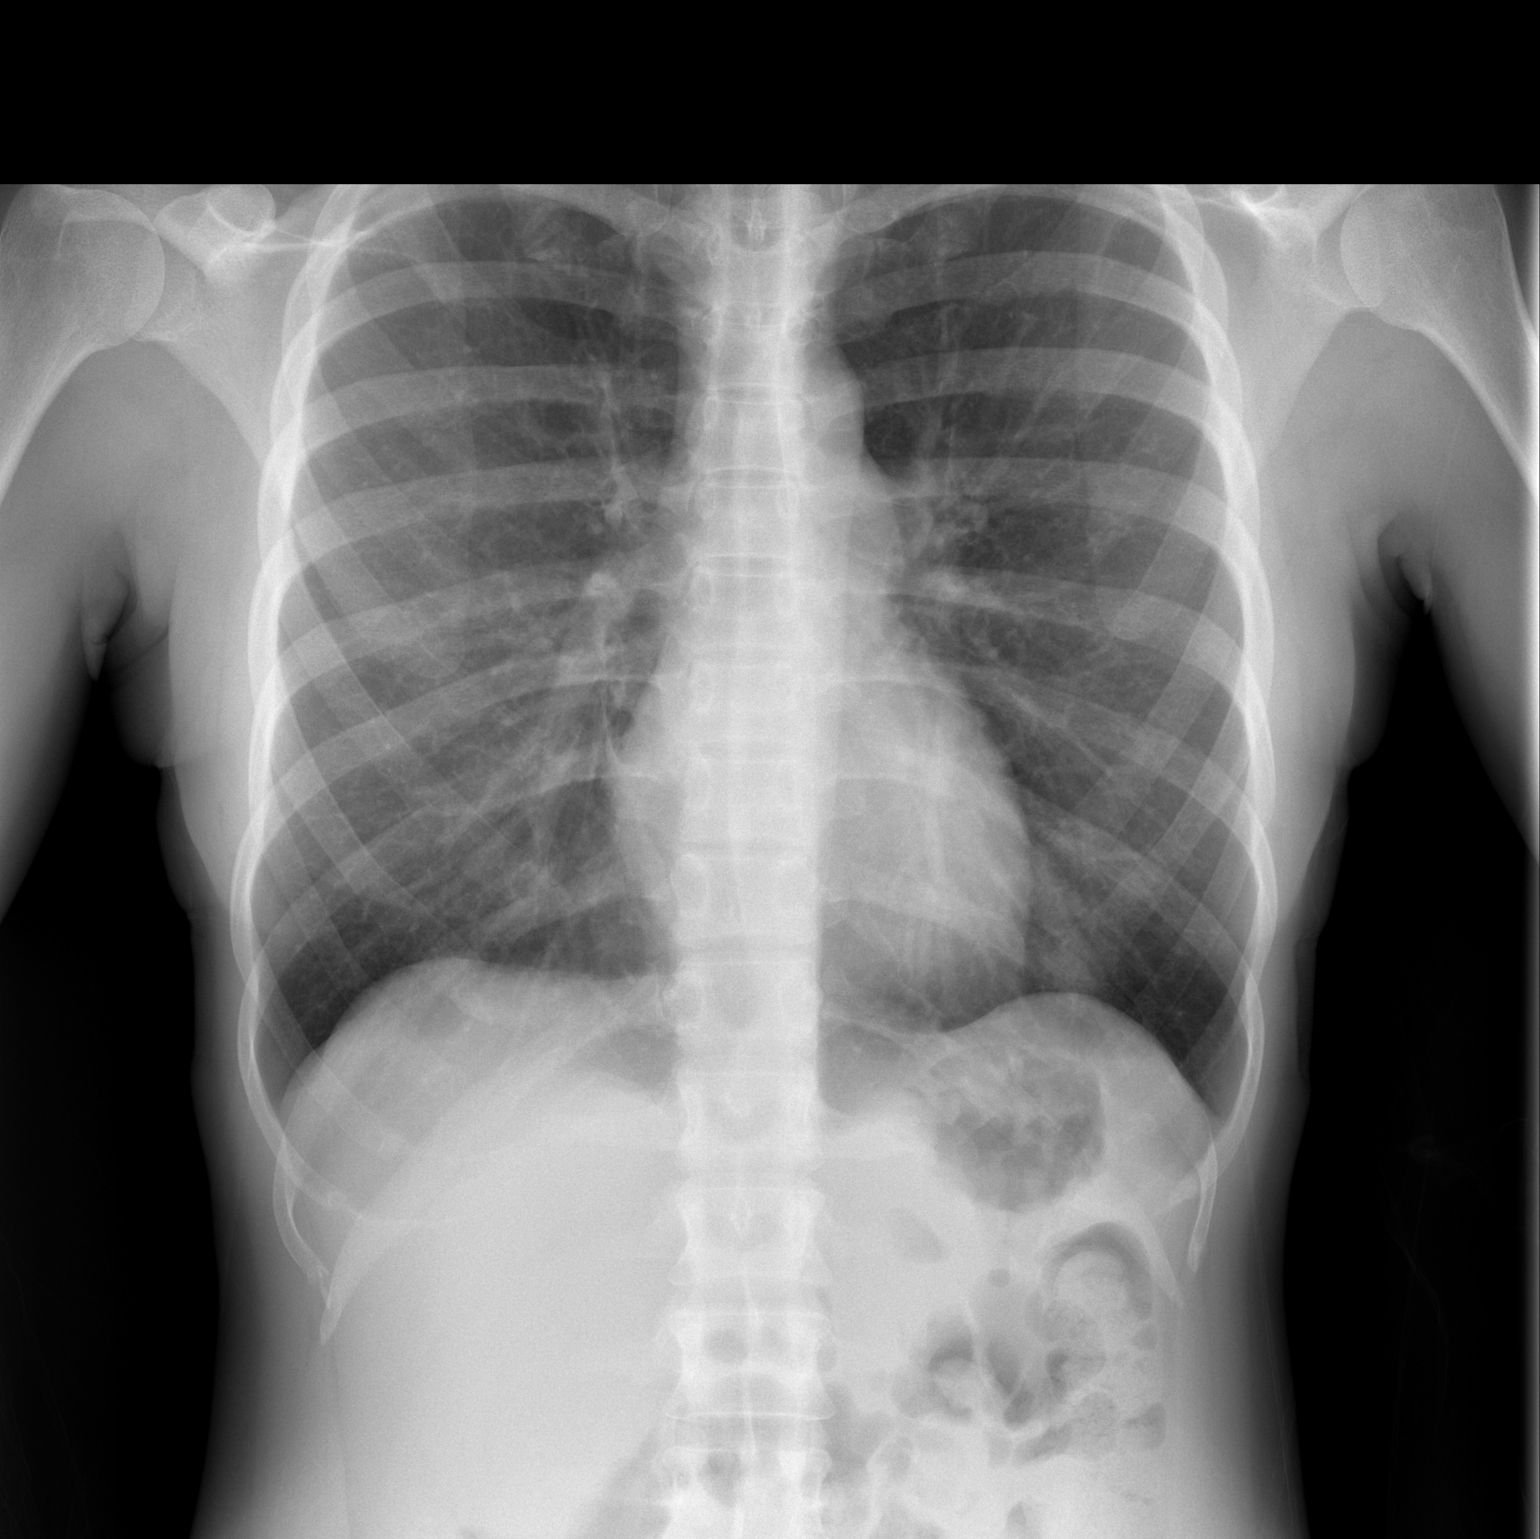

[w chest lat]
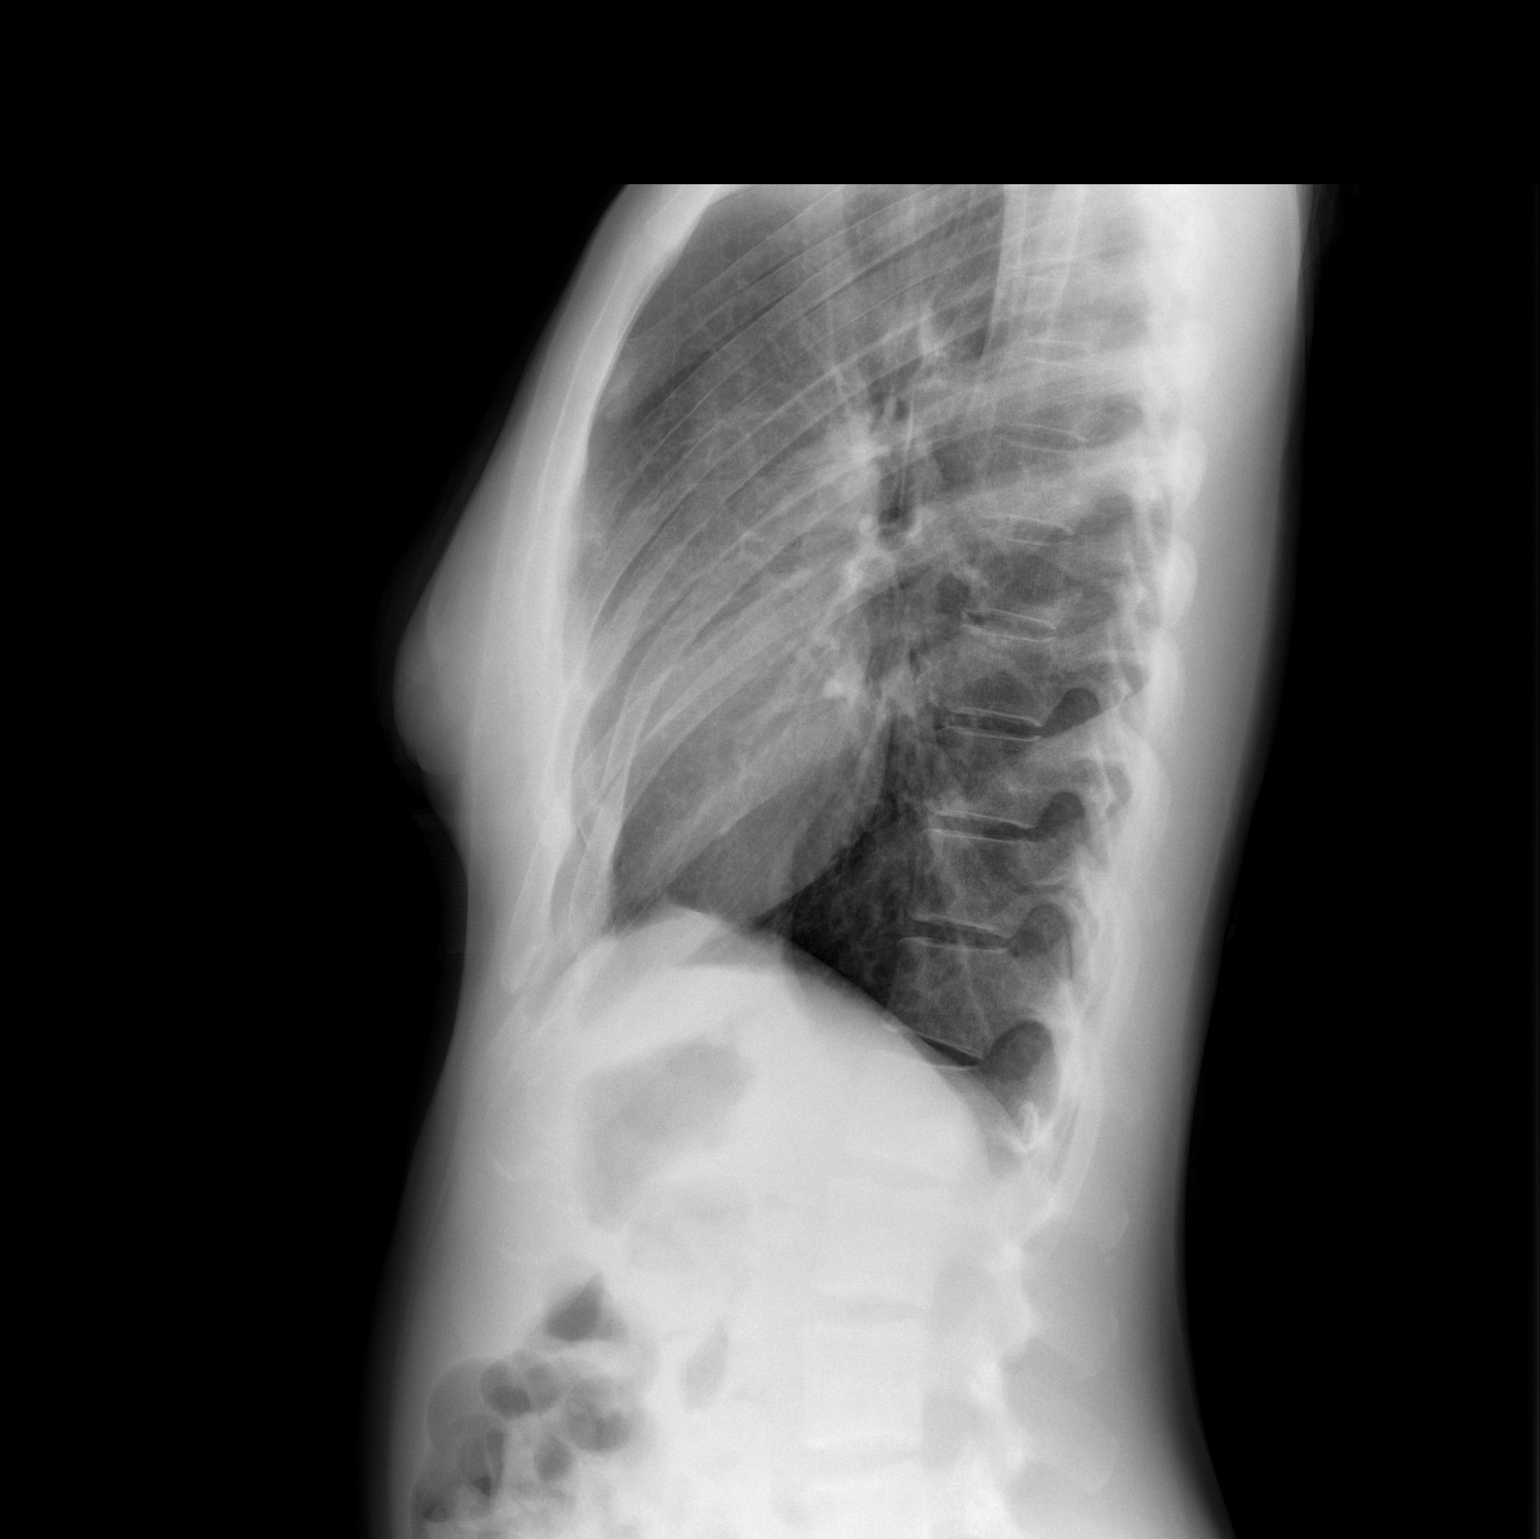

[2 of 2 positions shown; findings below may reference images not displayed]

FINDINGS: Cardiomediastinal contours and hilar structures are unremarkable.

Lungs are clear.

No signs of pleural effusion.

No acute bone finding.
IMPRESSION: Normal chest.
